# Patient Record
Sex: Female | Born: 1980 | Race: White | Hispanic: No | Smoking: Current every day smoker
Health system: Southern US, Community
[De-identification: ages and names within clinical notes are randomized; demographics above are authoritative.]

---

## 2002-11-06 ENCOUNTER — Emergency Department (HOSPITAL_COMMUNITY): Admission: EM | Admit: 2002-11-06 | Discharge: 2002-11-06 | Payer: Self-pay | Admitting: Emergency Medicine

## 2002-11-29 ENCOUNTER — Emergency Department (HOSPITAL_COMMUNITY): Admission: EM | Admit: 2002-11-29 | Discharge: 2002-11-29 | Payer: Self-pay | Admitting: Emergency Medicine

## 2002-12-02 ENCOUNTER — Emergency Department (HOSPITAL_COMMUNITY): Admission: EM | Admit: 2002-12-02 | Discharge: 2002-12-02 | Payer: Self-pay | Admitting: Emergency Medicine

## 2003-02-05 ENCOUNTER — Emergency Department (HOSPITAL_COMMUNITY): Admission: EM | Admit: 2003-02-05 | Discharge: 2003-02-06 | Payer: Self-pay | Admitting: Emergency Medicine

## 2003-02-05 ENCOUNTER — Encounter: Payer: Self-pay | Admitting: Emergency Medicine

## 2003-03-29 ENCOUNTER — Emergency Department (HOSPITAL_COMMUNITY): Admission: EM | Admit: 2003-03-29 | Discharge: 2003-03-29 | Payer: Self-pay | Admitting: Dentistry

## 2003-04-09 ENCOUNTER — Emergency Department (HOSPITAL_COMMUNITY): Admission: EM | Admit: 2003-04-09 | Discharge: 2003-04-09 | Payer: Self-pay | Admitting: Emergency Medicine

## 2003-05-01 ENCOUNTER — Emergency Department (HOSPITAL_COMMUNITY): Admission: EM | Admit: 2003-05-01 | Discharge: 2003-05-01 | Payer: Self-pay | Admitting: Emergency Medicine

## 2008-09-15 ENCOUNTER — Emergency Department (HOSPITAL_COMMUNITY): Admission: EM | Admit: 2008-09-15 | Discharge: 2008-09-16 | Payer: Self-pay | Admitting: Emergency Medicine

## 2008-11-18 ENCOUNTER — Ambulatory Visit (HOSPITAL_COMMUNITY): Admission: RE | Admit: 2008-11-18 | Discharge: 2008-11-18 | Payer: Self-pay | Admitting: Orthopedic Surgery

## 2009-09-14 ENCOUNTER — Emergency Department: Payer: Self-pay | Admitting: Emergency Medicine

## 2009-09-20 ENCOUNTER — Emergency Department: Payer: Self-pay | Admitting: Emergency Medicine

## 2009-09-24 ENCOUNTER — Emergency Department: Payer: Self-pay | Admitting: Emergency Medicine

## 2010-02-07 ENCOUNTER — Emergency Department: Payer: Self-pay | Admitting: Emergency Medicine

## 2010-02-24 ENCOUNTER — Inpatient Hospital Stay: Payer: Self-pay | Admitting: Internal Medicine

## 2010-02-25 ENCOUNTER — Inpatient Hospital Stay: Payer: Self-pay | Admitting: Psychiatry

## 2010-08-29 ENCOUNTER — Emergency Department (HOSPITAL_COMMUNITY): Admission: EM | Admit: 2010-08-29 | Discharge: 2010-08-29 | Payer: Self-pay | Admitting: Emergency Medicine

## 2010-09-27 ENCOUNTER — Emergency Department (HOSPITAL_COMMUNITY)
Admission: EM | Admit: 2010-09-27 | Discharge: 2010-09-27 | Payer: Self-pay | Source: Home / Self Care | Admitting: Emergency Medicine

## 2010-11-03 ENCOUNTER — Inpatient Hospital Stay (HOSPITAL_COMMUNITY)
Admission: EM | Admit: 2010-11-03 | Discharge: 2010-11-08 | Payer: Self-pay | Source: Home / Self Care | Admitting: Emergency Medicine

## 2010-12-08 LAB — COMPREHENSIVE METABOLIC PANEL
ALT: 39 U/L — ABNORMAL HIGH (ref 0–35)
AST: 37 U/L (ref 0–37)
Albumin: 3.8 g/dL (ref 3.5–5.2)
Alkaline Phosphatase: 55 U/L (ref 39–117)
BUN: 8 mg/dL (ref 6–23)
CO2: 22 mEq/L (ref 19–32)
Calcium: 9 mg/dL (ref 8.4–10.5)
Chloride: 109 mEq/L (ref 96–112)
Creatinine, Ser: 0.68 mg/dL (ref 0.4–1.2)
GFR calc Af Amer: >60 mL/min (ref >60)
GFR calc non Af Amer: >60 mL/min (ref >60)
Glucose, Bld: 82 mg/dL (ref 70–99)
Potassium: 4.3 mEq/L (ref 3.5–5.1)
Sodium: 141 mEq/L (ref 135–145)
Total Bilirubin: 0.8 mg/dL (ref 0.3–1.2)
Total Protein: 6.5 g/dL (ref 6.0–8.3)

## 2010-12-08 LAB — URINALYSIS, ROUTINE W REFLEX MICROSCOPIC
Hemoglobin, Urine: NEGATIVE
Nitrite: NEGATIVE
Protein, ur: NEGATIVE mg/dL
Specific Gravity, Urine: 1.025 (ref 1.005–1.030)
Urine Glucose, Fasting: NEGATIVE mg/dL
Urobilinogen, UA: 0.2 mg/dL (ref 0.0–1.0)
pH: 6 (ref 5.0–8.0)

## 2010-12-08 LAB — CBC
HCT: 42.1 % (ref 36.0–46.0)
Hemoglobin: 14.5 g/dL (ref 12.0–15.0)
MCH: 32.4 pg (ref 26.0–34.0)
MCHC: 34.4 g/dL (ref 30.0–36.0)
MCV: 94.2 fL (ref 78.0–100.0)
Platelets: 268 10*3/uL (ref 150–400)
RBC: 4.47 MIL/uL (ref 3.87–5.11)
RDW: 14.6 % (ref 11.5–15.5)
WBC: 5.6 10*3/uL (ref 4.0–10.5)

## 2010-12-08 LAB — HCG, QUANTITATIVE, PREGNANCY: hCG, Beta Chain, Quant, S: <2 m[IU]/mL (ref <5)

## 2010-12-15 ENCOUNTER — Ambulatory Visit (HOSPITAL_COMMUNITY)
Admission: RE | Admit: 2010-12-15 | Discharge: 2010-12-15 | Payer: Self-pay | Source: Home / Self Care | Attending: Obstetrics & Gynecology | Admitting: Obstetrics & Gynecology

## 2010-12-30 LAB — SURGICAL PCR SCREEN
MRSA, PCR: NEGATIVE
Staphylococcus aureus: NEGATIVE

## 2011-02-14 LAB — DIFFERENTIAL
Basophils Absolute: 0 10*3/uL (ref 0.0–0.1)
Basophils Relative: 0 % (ref 0–1)
Eosinophils Absolute: 0.2 10*3/uL (ref 0.0–0.7)
Eosinophils Relative: 2 % (ref 0–5)
Lymphocytes Relative: 17 % (ref 12–46)
Lymphs Abs: 1.7 10*3/uL (ref 0.7–4.0)
Monocytes Absolute: 0.7 10*3/uL (ref 0.1–1.0)
Monocytes Relative: 7 % (ref 3–12)
Neutro Abs: 7.6 10*3/uL (ref 1.7–7.7)
Neutrophils Relative %: 74 % (ref 43–77)

## 2011-02-14 LAB — CBC
HCT: 37.7 % (ref 36.0–46.0)
Hemoglobin: 13.1 g/dL (ref 12.0–15.0)
MCH: 32.7 pg (ref 26.0–34.0)
MCHC: 34.7 g/dL (ref 30.0–36.0)
MCV: 94 fL (ref 78.0–100.0)
Platelets: 313 10*3/uL (ref 150–400)
RBC: 4.01 MIL/uL (ref 3.87–5.11)
RDW: 12.2 % (ref 11.5–15.5)
WBC: 10.2 10*3/uL (ref 4.0–10.5)

## 2011-02-14 LAB — GC/CHLAMYDIA PROBE AMP, URINE
Chlamydia, Swab/Urine, PCR: NEGATIVE
GC Probe Amp, Urine: NEGATIVE

## 2011-02-15 LAB — URINALYSIS, ROUTINE W REFLEX MICROSCOPIC
Glucose, UA: NEGATIVE mg/dL
Leukocytes, UA: NEGATIVE
Nitrite: NEGATIVE
Protein, ur: 30 mg/dL — AB
Specific Gravity, Urine: >1.03 — ABNORMAL HIGH (ref 1.005–1.030)
Urobilinogen, UA: 0.2 mg/dL (ref 0.0–1.0)
pH: 5.5 (ref 5.0–8.0)

## 2011-02-15 LAB — DIFFERENTIAL
Basophils Absolute: 0 10*3/uL (ref 0.0–0.1)
Basophils Relative: 0 % (ref 0–1)
Eosinophils Absolute: 0.1 10*3/uL (ref 0.0–0.7)
Eosinophils Relative: 1 % (ref 0–5)
Lymphocytes Relative: 19 % (ref 12–46)
Lymphs Abs: 2.2 10*3/uL (ref 0.7–4.0)
Monocytes Absolute: 1 10*3/uL (ref 0.1–1.0)
Monocytes Relative: 8 % (ref 3–12)
Neutro Abs: 8.3 10*3/uL — ABNORMAL HIGH (ref 1.7–7.7)
Neutrophils Relative %: 72 % (ref 43–77)

## 2011-02-15 LAB — CBC
HCT: 38.2 % (ref 36.0–46.0)
Hemoglobin: 13.7 g/dL (ref 12.0–15.0)
MCH: 33.6 pg (ref 26.0–34.0)
MCHC: 35.9 g/dL (ref 30.0–36.0)
MCV: 93.6 fL (ref 78.0–100.0)
Platelets: 302 10*3/uL (ref 150–400)
RBC: 4.08 MIL/uL (ref 3.87–5.11)
RDW: 12.3 % (ref 11.5–15.5)
WBC: 11.6 10*3/uL — ABNORMAL HIGH (ref 4.0–10.5)

## 2011-02-15 LAB — URINE MICROSCOPIC-ADD ON

## 2011-02-15 LAB — COMPREHENSIVE METABOLIC PANEL
ALT: 11 U/L (ref 0–35)
AST: 13 U/L (ref 0–37)
Albumin: 3.6 g/dL (ref 3.5–5.2)
Alkaline Phosphatase: 62 U/L (ref 39–117)
BUN: 8 mg/dL (ref 6–23)
CO2: 25 mEq/L (ref 19–32)
Calcium: 8.8 mg/dL (ref 8.4–10.5)
Chloride: 107 mEq/L (ref 96–112)
Creatinine, Ser: 0.73 mg/dL (ref 0.4–1.2)
GFR calc Af Amer: >60 mL/min (ref >60)
GFR calc non Af Amer: >60 mL/min (ref >60)
Glucose, Bld: 103 mg/dL — ABNORMAL HIGH (ref 70–99)
Potassium: 3.5 mEq/L (ref 3.5–5.1)
Sodium: 139 mEq/L (ref 135–145)
Total Bilirubin: 0.5 mg/dL (ref 0.3–1.2)
Total Protein: 6.4 g/dL (ref 6.0–8.3)

## 2011-02-15 LAB — PREGNANCY, URINE: Preg Test, Ur: NEGATIVE

## 2011-02-15 LAB — LIPASE, BLOOD: Lipase: 19 U/L (ref 11–59)

## 2011-04-05 IMAGING — CT CT ABD-PELV W/ CM
2 of 4 series · 15 of 46 positions shown, 17 images · IV contrast (agent unspecified)
Comparison: None.

CLINICAL DATA: Right-sided pain and swelling.  Fever.  Loss of
appetite.  History of hepatitis C and ectopic pregnancy.  Question
appendicitis.

CT ABDOMEN AND PELVIS WITH CONTRAST
TECHNIQUE: Multidetector CT imaging of the abdomen and pelvis was
performed following the standard protocol during bolus
administration of intravenous contrast.
Contrast: 100  ml [id number]

[Series 2: abd_pel_with 5.0 b40f · axial · 0.59mm/px · z∈[-426,-36]mm · 12 of 86 slices shown, 14 images]
[im 4/86  soft-tissue]
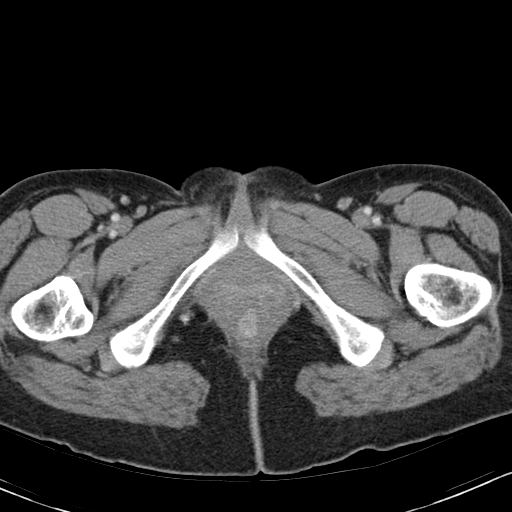
[im 4/86  bone]
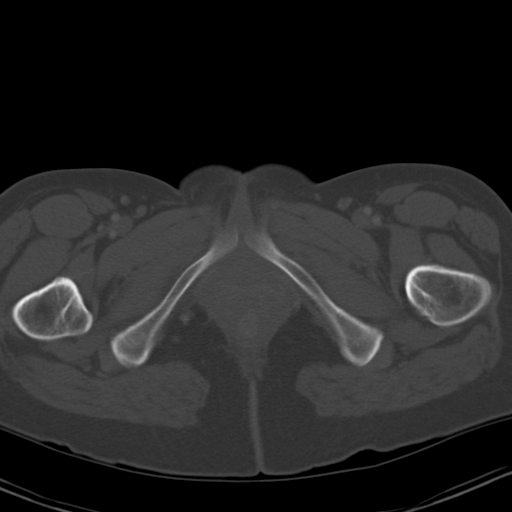
[im 11/86  soft-tissue]
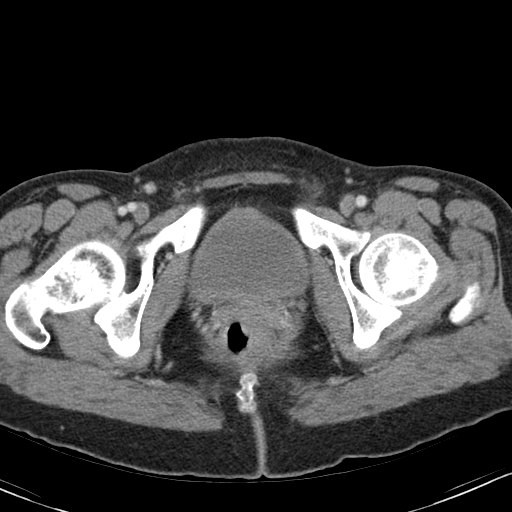
[im 18/86  soft-tissue]
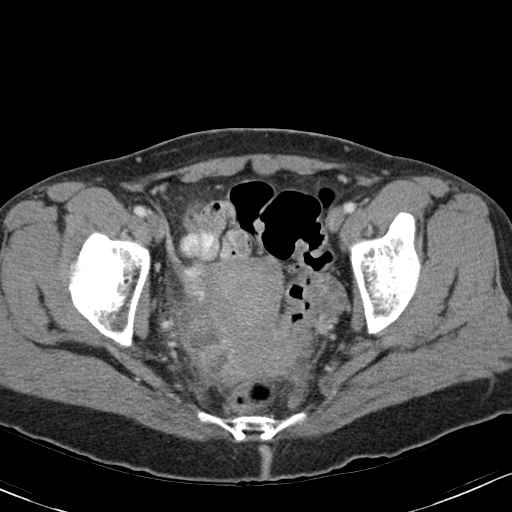
[im 25/86  soft-tissue]
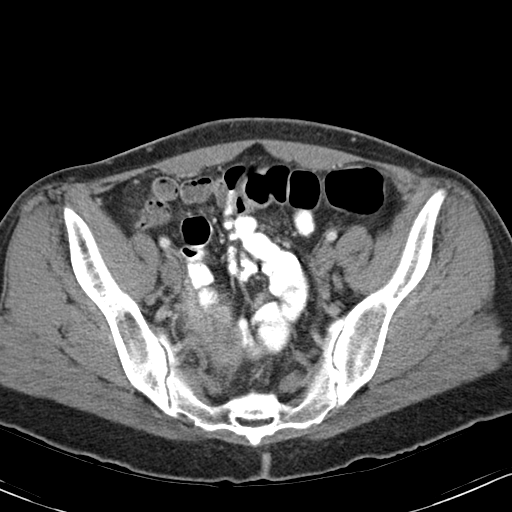
[im 32/86  soft-tissue]
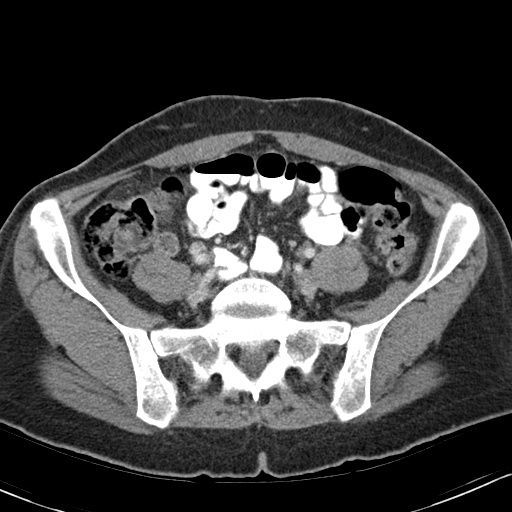
[im 39/86  soft-tissue]
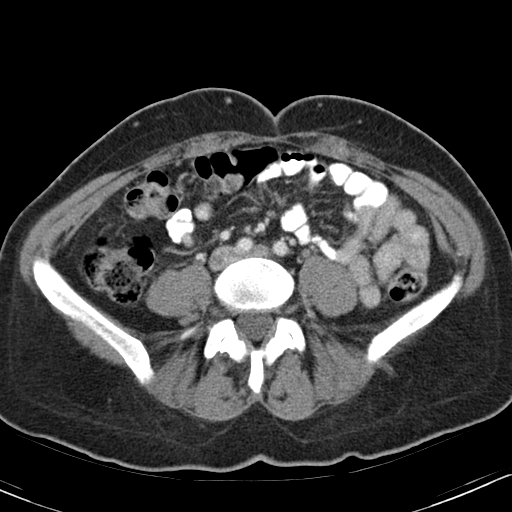
[im 47/86  soft-tissue]
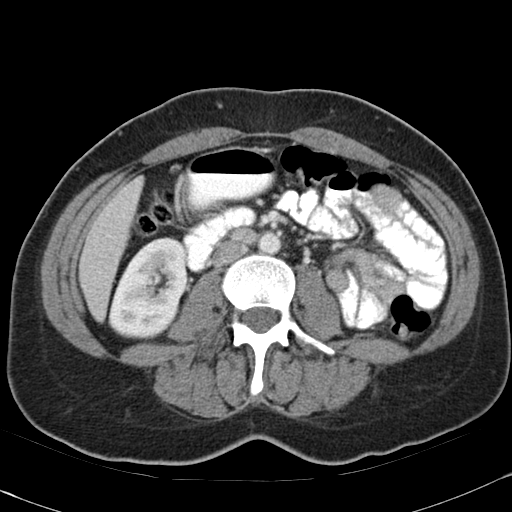
[im 54/86  soft-tissue]
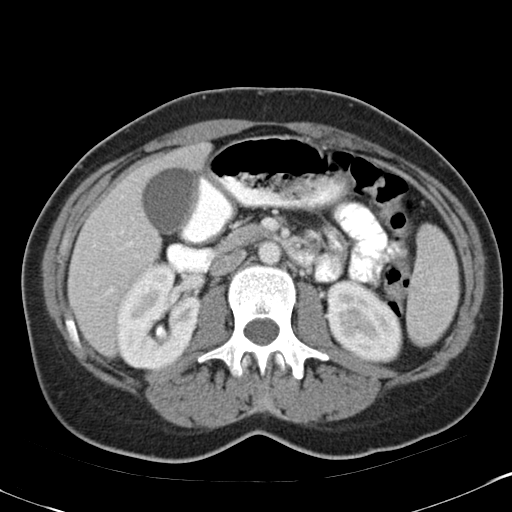
[im 61/86  soft-tissue]
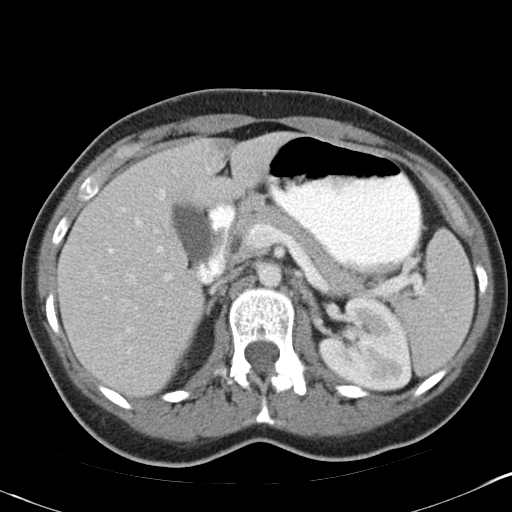
[im 61/86  bone]
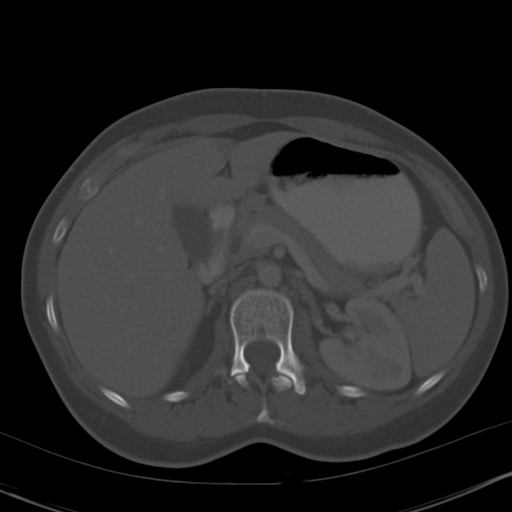
[im 68/86  soft-tissue]
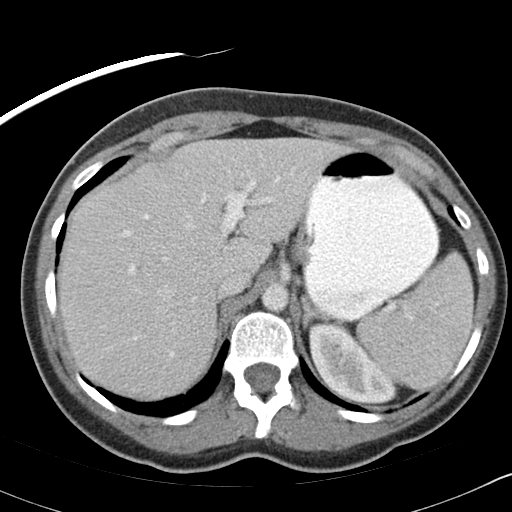
[im 75/86  soft-tissue]
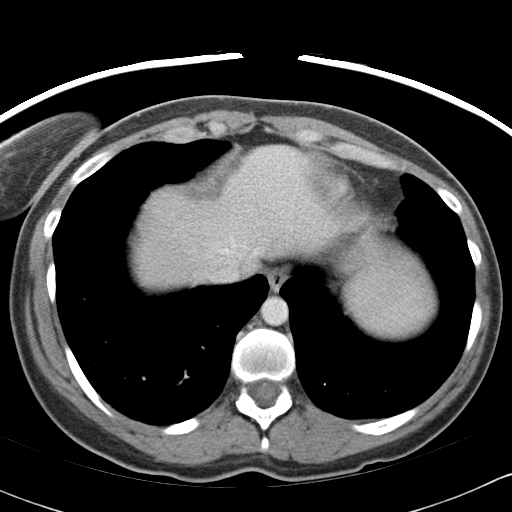
[im 82/86  soft-tissue]
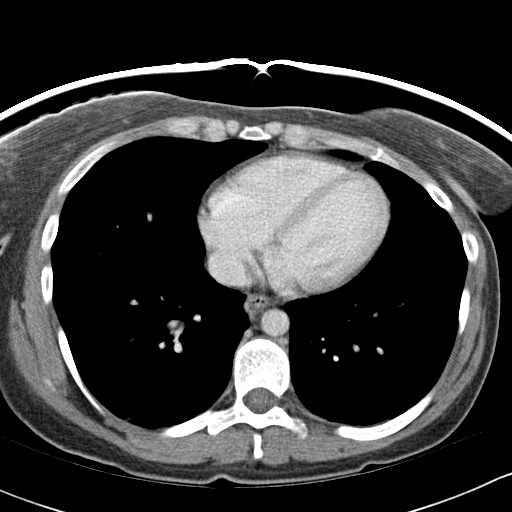

[Series 4: abd_pel_with 3.0 spo cor · coronal · 0.64mm/px · 3 of 68 slices shown]
[im 23/68  soft-tissue]
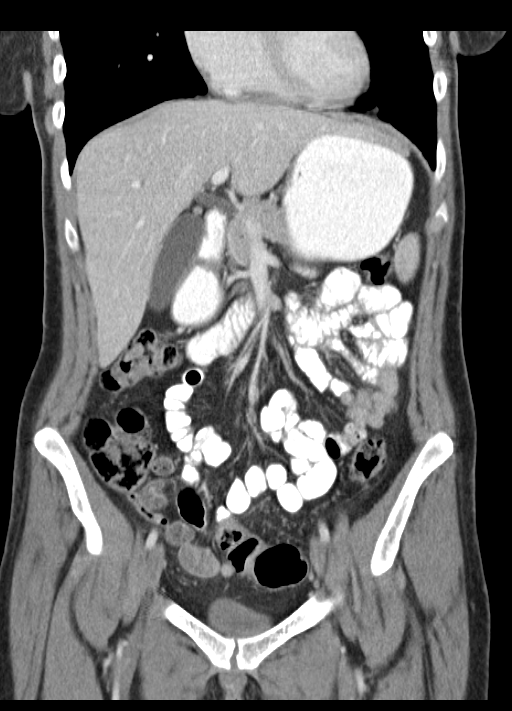
[im 30/68  soft-tissue]
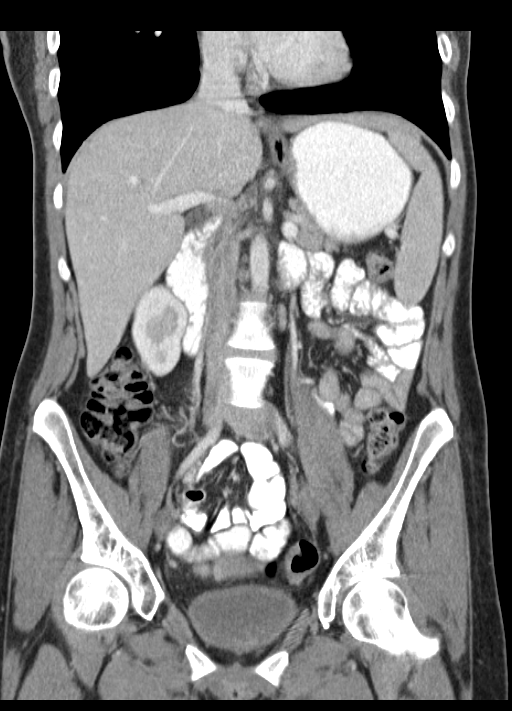
[im 38/68  soft-tissue]
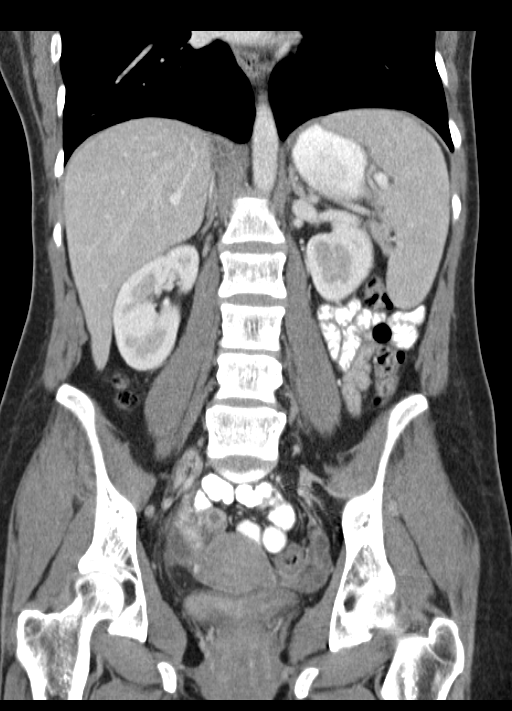

[15 of 46 positions shown; findings below may reference images not displayed]

FINDINGS: Subpleural lymph nodes at the lung bases bilaterally.
Normal heart size without pericardial or pleural effusion.  A too
small to characterize right liver lobe lesion.  Focal steatosis
adjacent the falciform.  Normal spleen, stomach, pancreas,
gallbladder, biliary tract, adrenal glands.  Tiny upper pole left
renal cyst.  Normal right kidney with early excretion of contrast
into the collecting system.
1.0 cm aortocaval lymph node on image 40.  No retrocrural
adenopathy. Normal colon and terminal ileum.  There is right lower
quadrant edema.  This is felt to arise from the right adnexa.
The appendix is air-filled and not thickened, including on image 23
coronal.

Normal small bowel without abdominal ascites.  Right external iliac
adenopathy 1.3 cm on image 65.

Normal urinary bladder and uterus.  Normal left ovary on image 68.
Abnormal appearance of the right adnexa.  There is heterogeneous
enhancement with numerous low density foci within.  A somewhat
cylindrical fluid density structure measures 4.3 x 2.1 cm on image
63 transverse and image 37 sagittal.  Surrounding inflammation.
Minimal cul-de-sac fluid which could be secondary.

Equivocal low density within the uterine cervix on image 42
sagittal could represent a Nabothian cyst.

No acute osseous abnormality.
IMPRESSION: 1.  Inflammatory process centered in the right adnexa.  Suspect
pelvic inflammatory disease with hydrosalpinx/pyosalpinx and
possible tubo-ovarian abscess.
2.  Edema tracks into the right upper pelvis, including adjacent
the appendix.  The appendix is however, normal in appearance.
3.  Right pelvic side wall and borderline retroperitoneal abdominal
adenopathy.  Most likely related to the adnexal inflammation.

## 2011-04-06 IMAGING — US US PELVIS COMPLETE
1 series · 13 of 25 positions shown · non-contrast
Comparison: CT 11/03/2010.

CLINICAL DATA: History is given of right tubo-ovarian abscess.  LMP
10/29/2010.

TRANSABDOMINAL AND TRANSVAGINAL ULTRASOUND OF PELVIS
DOPPLER ULTRASOUND OF OVARIES
TECHNIQUE: Both transabdominal and transvaginal ultrasound
examinations of the pelvis were performed including evaluation of
the uterus, ovaries, adnexal regions, and pelvic cul-de-sac.
Transabdominal technique was performed for global imaging of the
pelvis.  Transvaginal technique was performed for detailed
evaluation of the endometrium and/or ovaries.  Color and duplex
Doppler ultrasound was utilized to evaluate blood flow to the
ovaries.

[Series 1: us pelvis complete · 0.25mm/px · 13 of 105 slices shown]
[im 1/105]
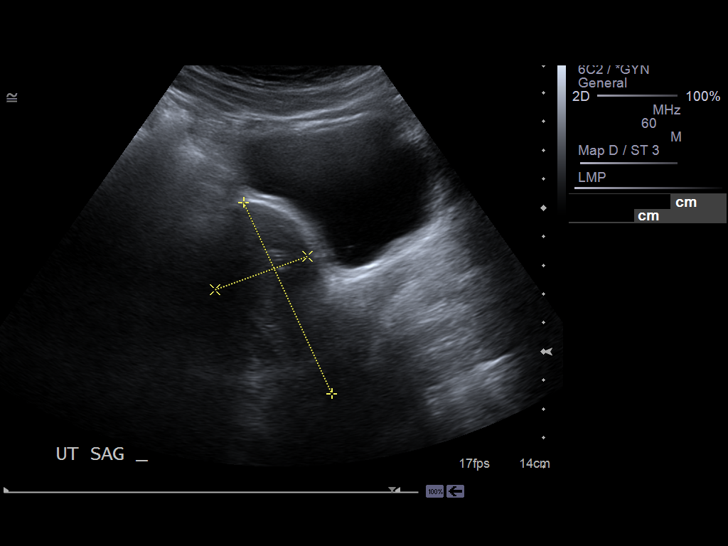
[im 9/105]
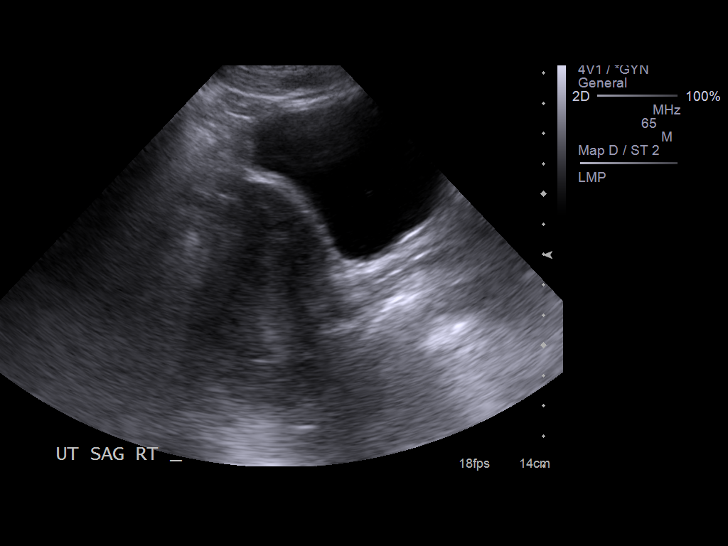
[im 18/105]
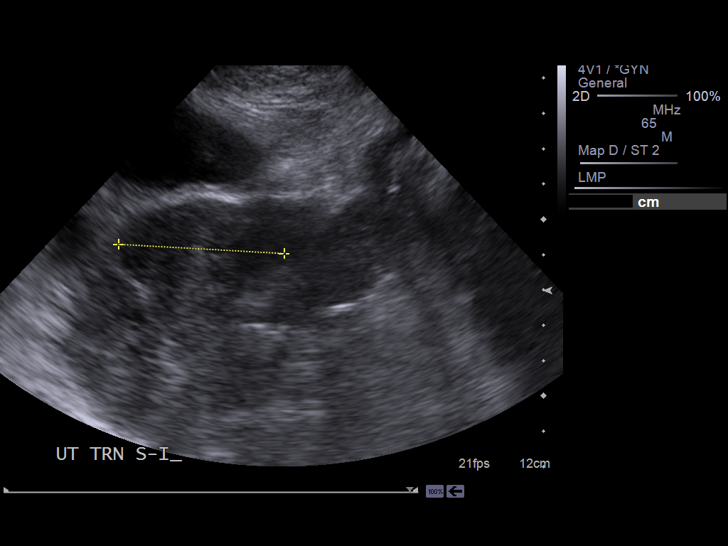
[im 27/105]
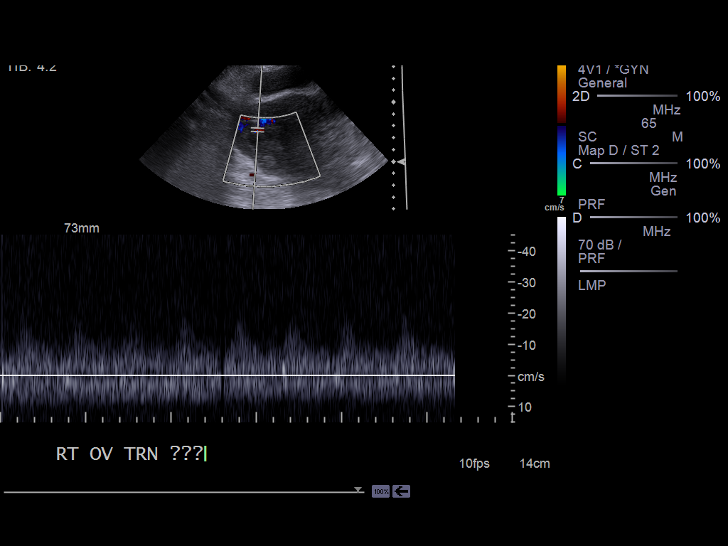
[im 35/105]
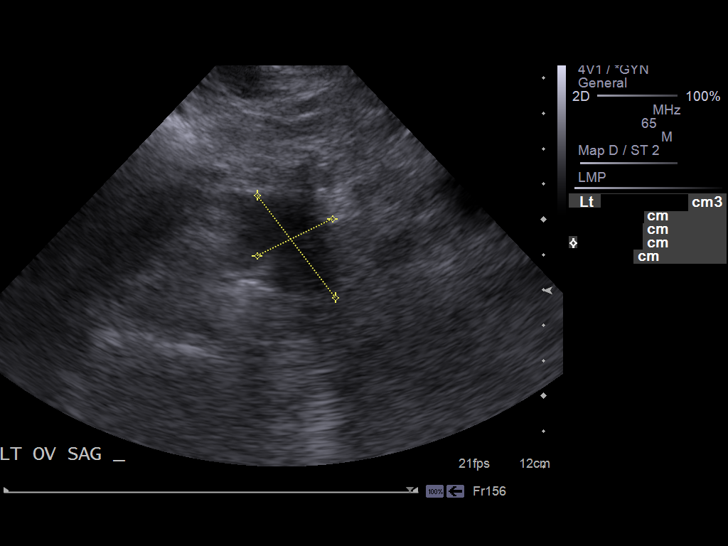
[im 44/105]
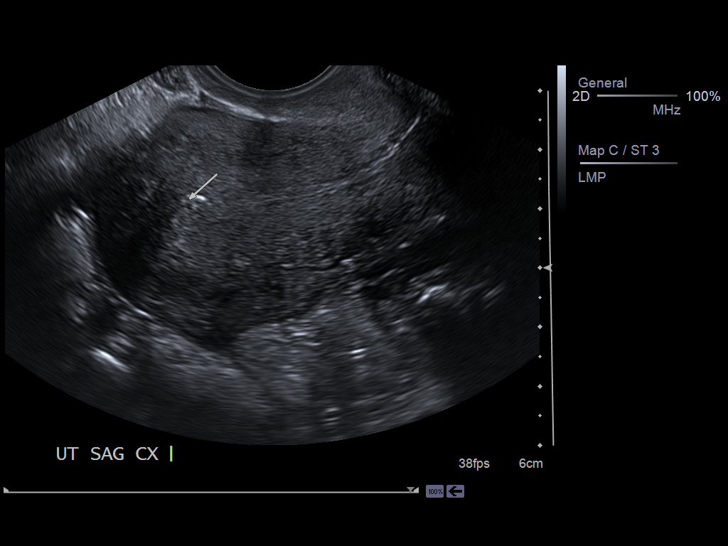
[im 53/105]
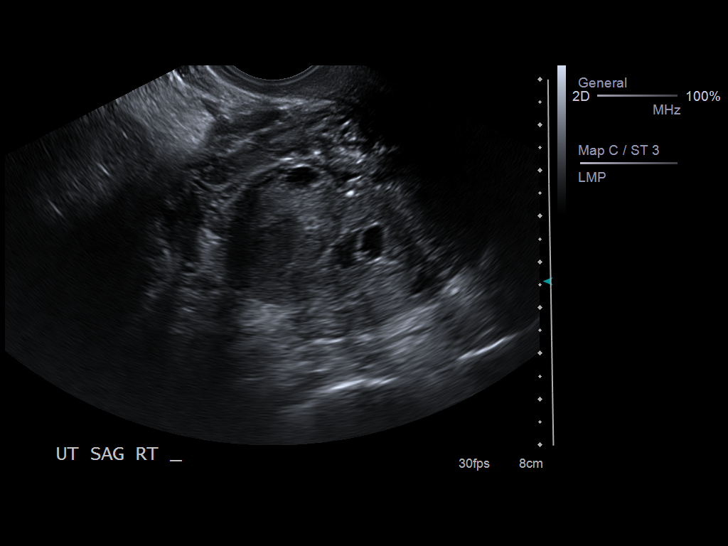
[im 61/105]
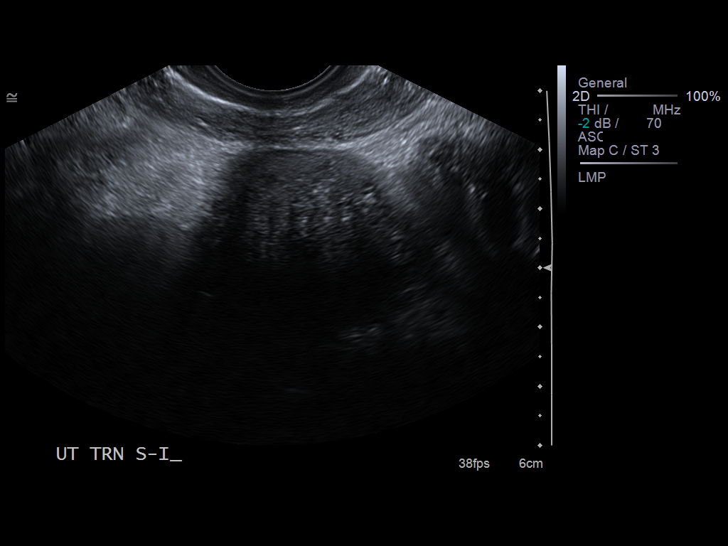
[im 70/105]
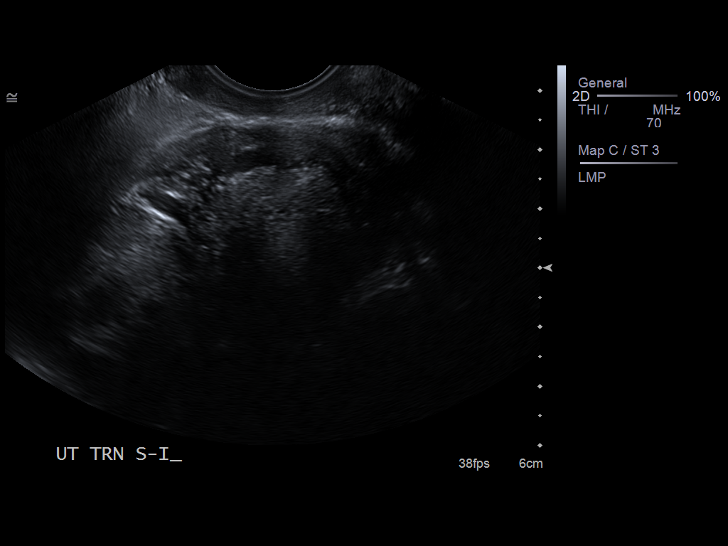
[im 79/105]
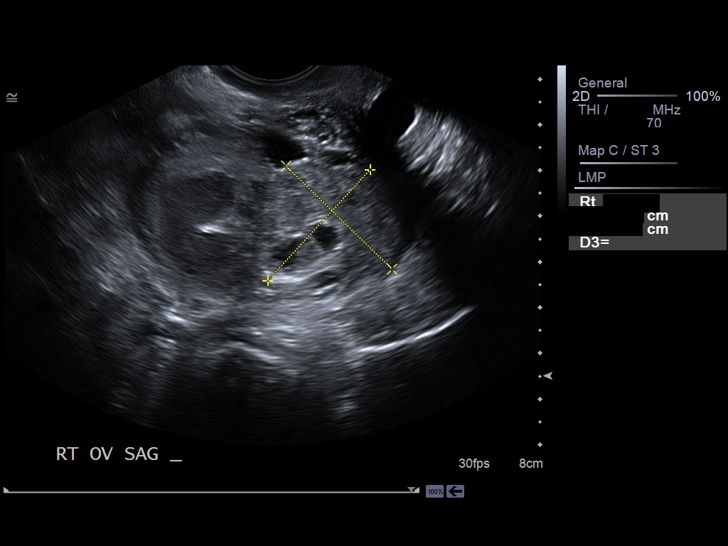
[im 87/105]
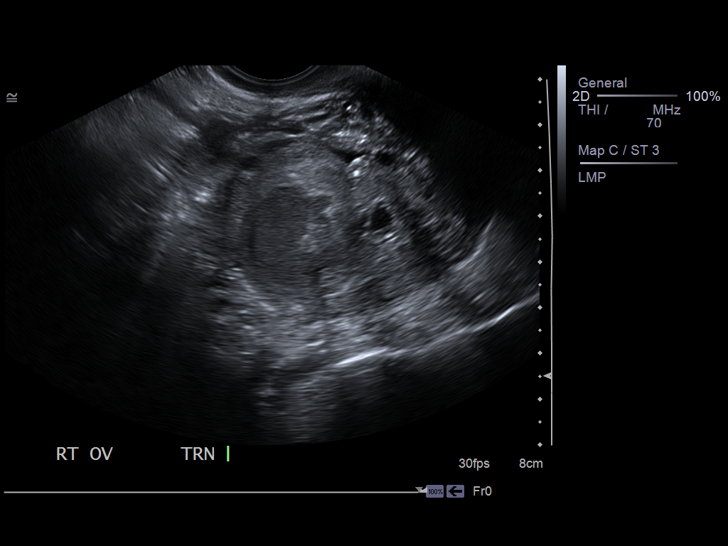
[im 96/105]
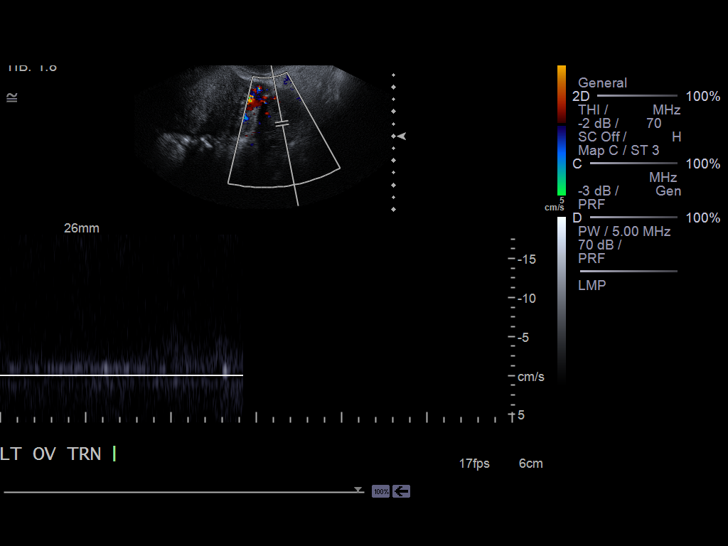
[im 105/105]
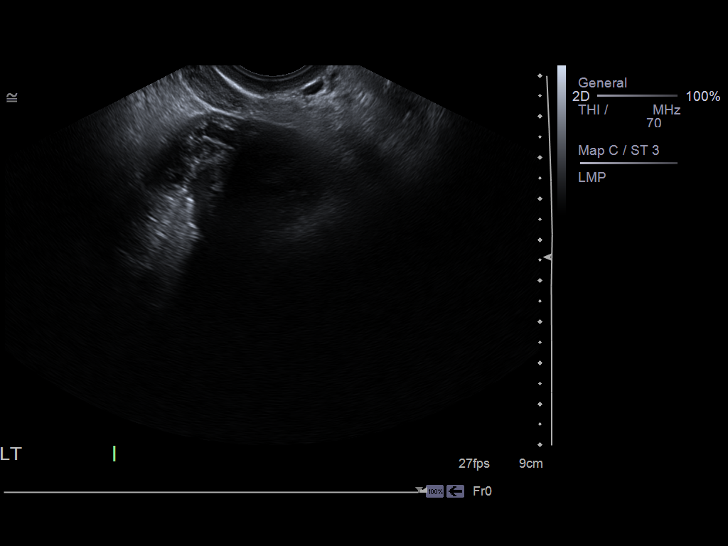

[13 of 25 positions shown; findings below may reference images not displayed]

FINDINGS: Uterus measures 7.1 x 3.2 x 4.2 cm.  There are hypoechoic focal
areas within the myometrium with some calcifications.  With color
Doppler flow these show increased flow.  This appearance is
associated with adenomyosis.  No uterine fibroid is evident.

Endometrium measures 4.4 mm.  There are some endometrial
calcifications.  No endometrial mass or fluid collection is
evident.

Right Ovary right ovary measures 3.3 x 3.2 x 2.2 cm.  There is a
heterogeneous appearance of the right ovary with adjacent
hydrosalpinx which appears to be full of fluid or debris.  The
tubal structure measures 4.6 x 3.2 x 3.1 cm.  There is no evidence
of ovarian torsion.  Color and duplex Doppler  ultrasound
examination show a increased expected blood flow to the right ovary
with expected arterial and venous wave forms.

Left Ovary measures 2.3 x 2.4 x 2.0 cm.  No left ovarian lesion is
seen.  No left hydrosalpinx is evident.  Color and duplex Doppler
examination shows normal appearing blood flow with no ischemia or
torsion.

Other Findings:  There is a small amount of free fluid in the
pelvis.
IMPRESSION: There appears to be hyperemic process on the right involving the
ovary with associated enlarged fallopian tube.  Fallopian tube
appears to be distended with fluid and debris.  This may reflect
pelvic inflammatory disease with ovarian and tubal involvement.
There is no evidence of ovarian torsion.

No left ovarian lesion is seen.  Endometrial calcifications are
seen.  Hypoechoic areas and calcifications are seen within the
myometrium with penetrating color Doppler flow in the myometrium.
This appearance may be associated with adenomyosis.

I discussed the imaging findings over the telephone with Dr. Ristovic
at [phone] hours.

## 2011-06-12 ENCOUNTER — Emergency Department: Payer: Self-pay | Admitting: Emergency Medicine

## 2011-07-12 ENCOUNTER — Ambulatory Visit: Payer: Self-pay | Admitting: Obstetrics and Gynecology

## 2011-09-05 LAB — WET PREP, GENITAL: Yeast Wet Prep HPF POC: NONE SEEN

## 2011-09-05 LAB — URINALYSIS, ROUTINE W REFLEX MICROSCOPIC
Ketones, ur: NEGATIVE
Nitrite: NEGATIVE
pH: 5.5

## 2011-09-05 LAB — GC/CHLAMYDIA PROBE AMP, GENITAL: Chlamydia, DNA Probe: NEGATIVE

## 2011-09-05 LAB — URINE MICROSCOPIC-ADD ON

## 2011-09-05 LAB — POCT I-STAT 4, (NA,K, GLUC, HGB,HCT): Sodium: 137

## 2011-09-05 LAB — PREGNANCY, URINE: Preg Test, Ur: NEGATIVE

## 2011-09-15 ENCOUNTER — Ambulatory Visit: Payer: Self-pay | Admitting: Obstetrics and Gynecology

## 2011-09-19 ENCOUNTER — Ambulatory Visit: Payer: Self-pay | Admitting: Obstetrics and Gynecology

## 2011-09-26 LAB — PATHOLOGY REPORT

## 2011-11-01 ENCOUNTER — Ambulatory Visit: Payer: Self-pay | Admitting: Obstetrics and Gynecology

## 2011-11-07 ENCOUNTER — Ambulatory Visit: Payer: Self-pay | Admitting: Obstetrics and Gynecology

## 2011-11-10 LAB — PATHOLOGY REPORT

## 2011-12-12 IMAGING — US US PELV - US TRANSVAGINAL
1 series · 14 of 25 positions shown · non-contrast
Comparison: none

REASON FOR EXAM: CPP lower left quadrant eval for left adnexal mass
COMMENTS:

[Series 1: us pelv - us transvaginal · 0.33mm/px · 14 of 73 slices shown]
[im 1/73]
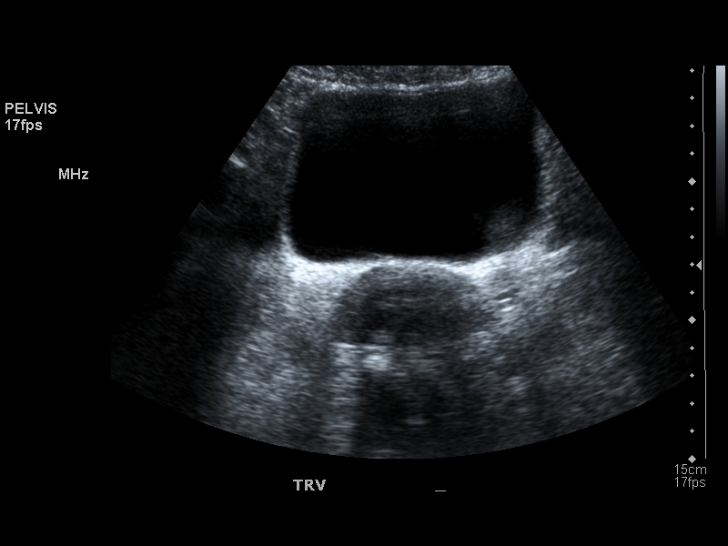
[im 7/73]
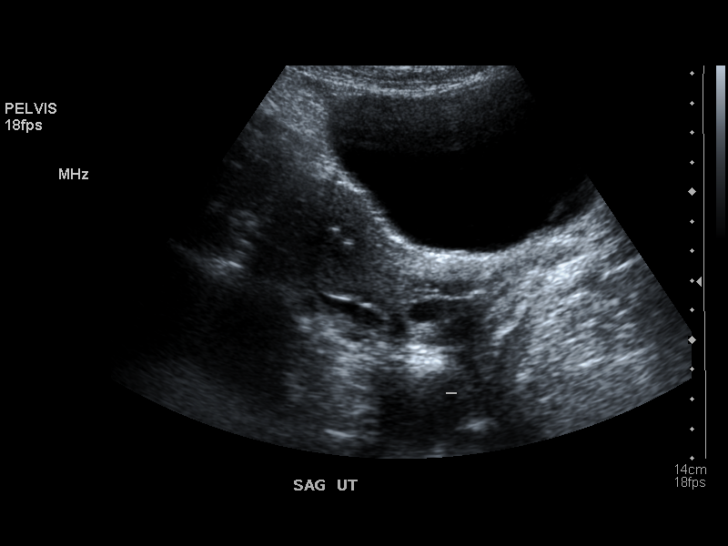
[im 13/73]
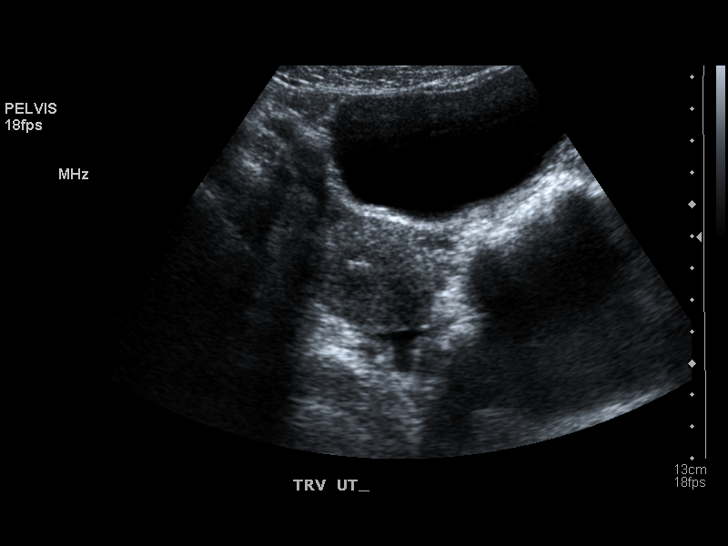
[im 19/73]
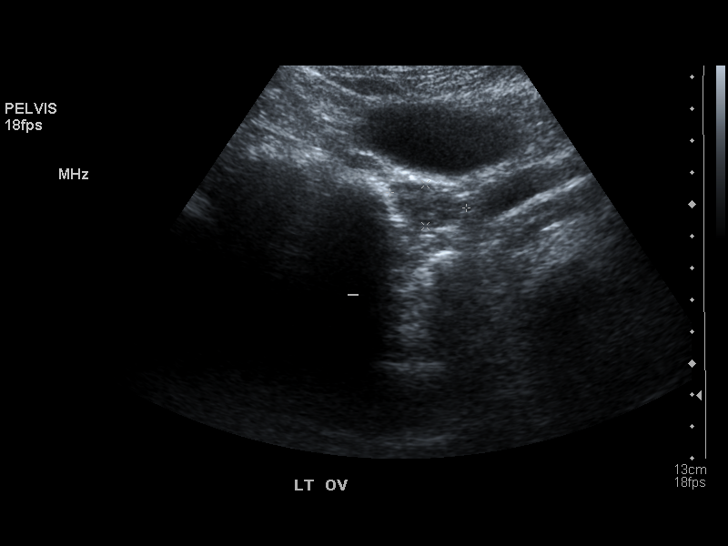
[im 25/73]
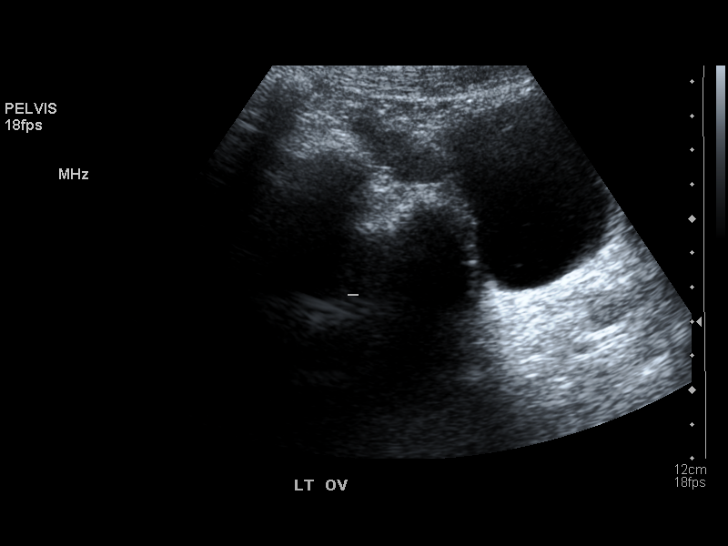
[im 28/73]
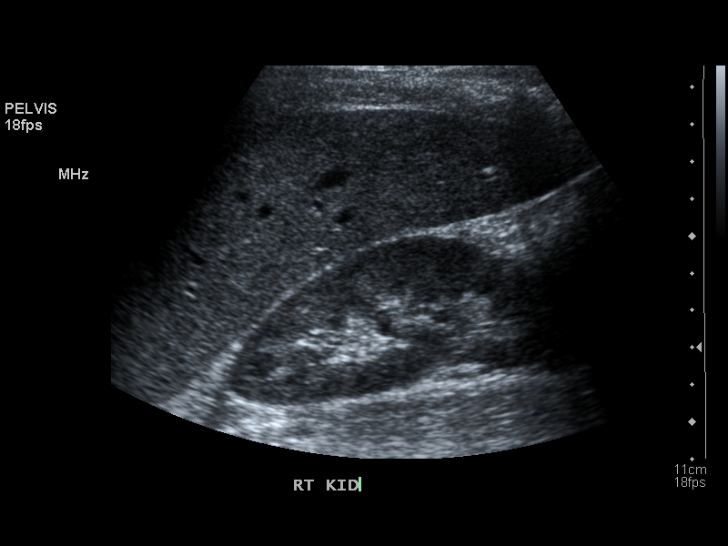
[im 34/73]
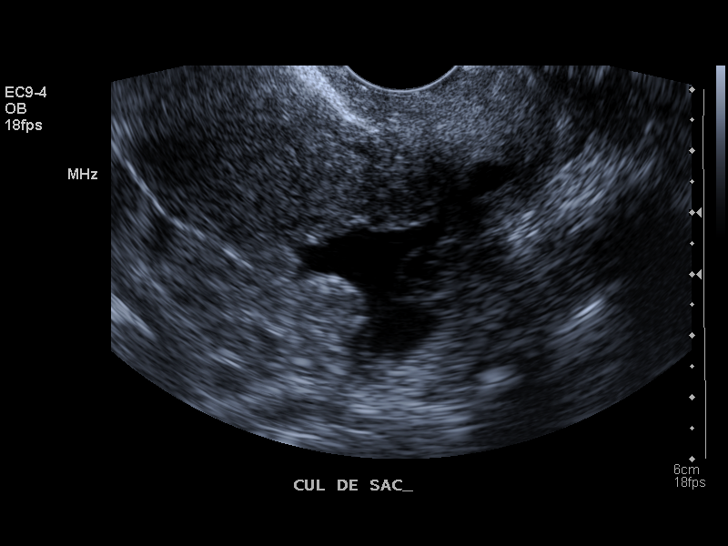
[im 40/73]
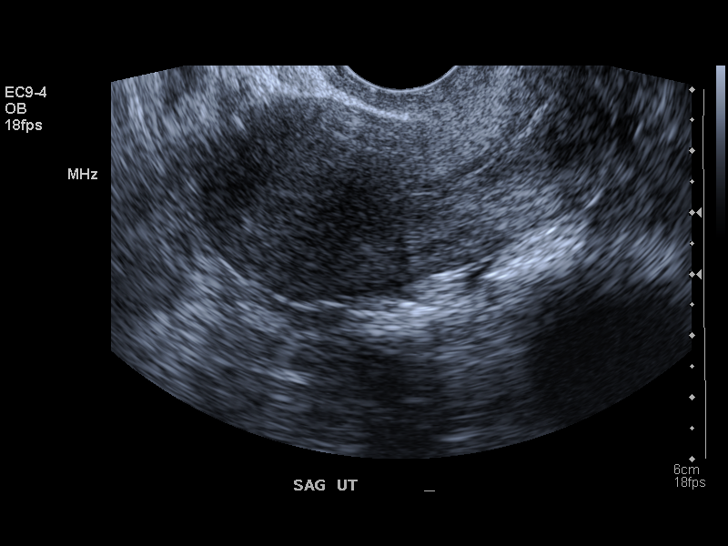
[im 46/73]
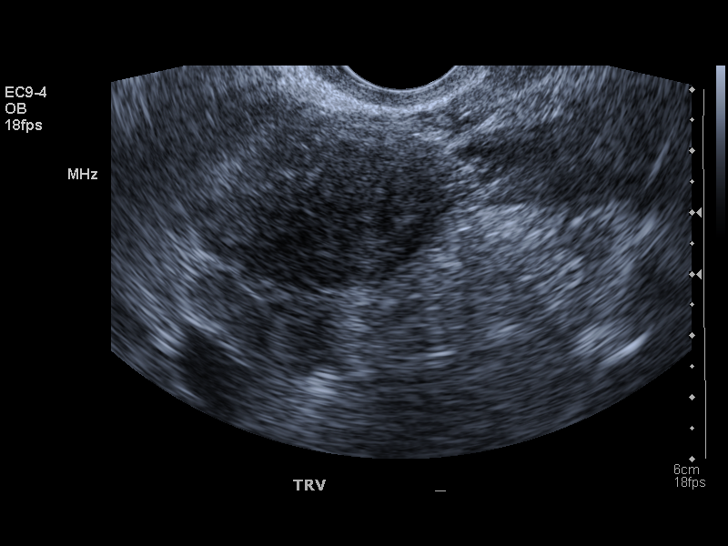
[im 49/73]
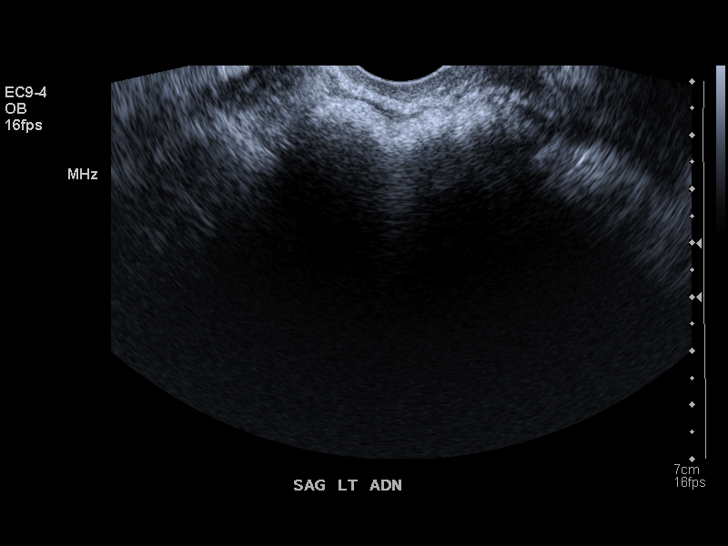
[im 55/73]
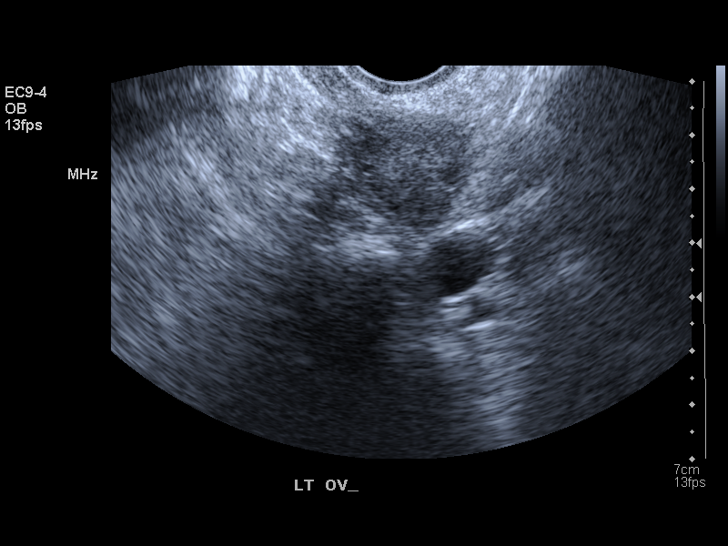
[im 61/73]
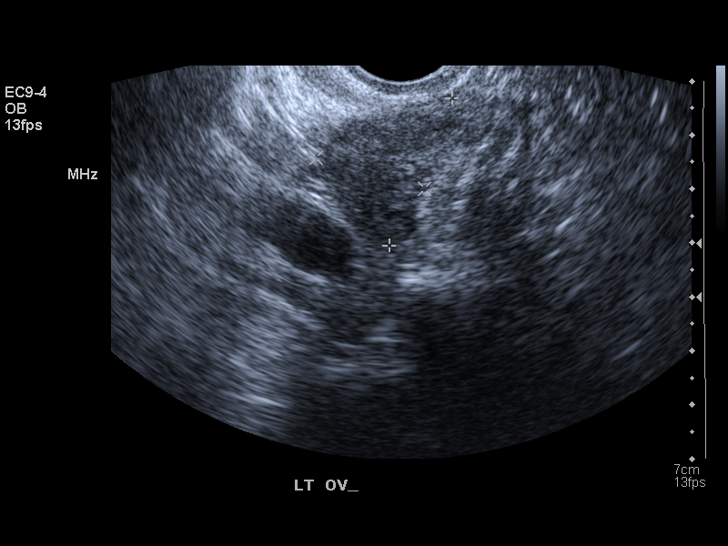
[im 67/73]
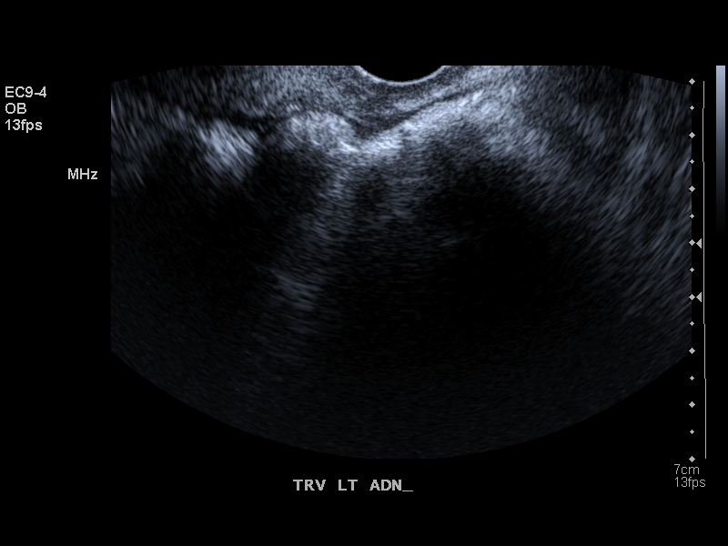
[im 73/73]
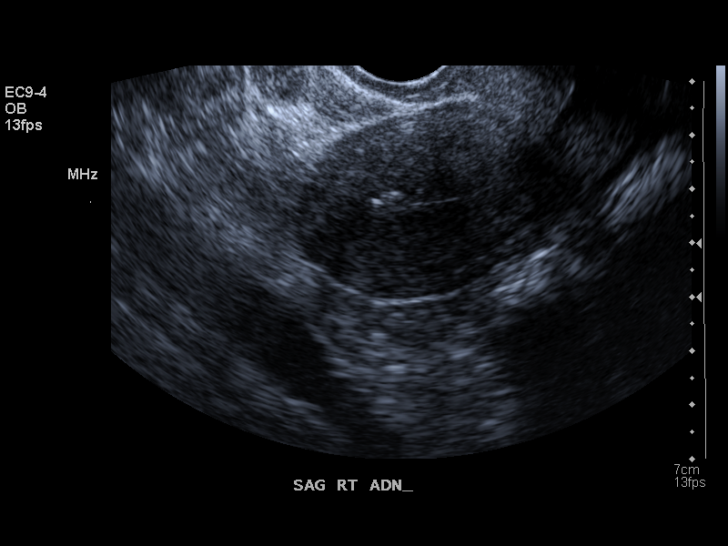

[14 of 25 positions shown; findings below may reference images not displayed]

PROCEDURE:     US  - US PELVIS EXAM W/TRANSVAGINAL  - July 12, 2011  [DATE]

RESULT:     Transabdominal and endovaginal pelvic sonogram is performed.
There is a history of right oophorectomy. The uterus measures 7.26 x 3.82 x
3.66 cm. There is fluid in the cul-de-sac. The left ovary measures 2.97 x
2.1 x 2.1 cm. The kidneys are grossly normal. Endometrial stripe thickness
is approximately 1 mm. Two areas of slightly increased echotexture is seen
along the endometrial stripe which could represent areas of calcification.
No shadowing is present, however. Endometrial polyps could be considered.
The echotexture is somewhat atypical.
IMPRESSION: Hyperechoic endometrial nodular densities as described. The uterus is
otherwise unremarkable. Left ovary appears grossly normal. Cul-de-sac to
left adnexal fluid is present.

## 2019-10-25 ENCOUNTER — Other Ambulatory Visit: Payer: Self-pay

## 2019-10-25 ENCOUNTER — Encounter (HOSPITAL_COMMUNITY): Payer: Self-pay | Admitting: Emergency Medicine

## 2019-10-25 ENCOUNTER — Emergency Department (HOSPITAL_COMMUNITY): Payer: Self-pay

## 2019-10-25 ENCOUNTER — Emergency Department (HOSPITAL_COMMUNITY)
Admission: EM | Admit: 2019-10-25 | Discharge: 2019-10-25 | Disposition: A | Discharge status: Home / Self Care | Payer: Self-pay | Attending: Emergency Medicine | Admitting: Emergency Medicine

## 2019-10-25 DIAGNOSIS — Z5321 Procedure and treatment not carried out due to patient leaving prior to being seen by health care provider: Secondary | ICD-10-CM | POA: Insufficient documentation

## 2019-10-25 DIAGNOSIS — M25521 Pain in right elbow: Secondary | ICD-10-CM | POA: Insufficient documentation

## 2019-10-25 NOTE — ED Triage Notes (Signed)
Patient states she fell off a 5 foot ladder, missed a step. Injury to her L thoracic area and her R elbow. Edema noted to elbow. Occurred at approx 1500 today.
